# Patient Record
Sex: Female | Born: 1955 | Race: White | Hispanic: No | Marital: Married | Smoking: Former smoker
Health system: Southern US, Community
[De-identification: ages and names within clinical notes are randomized; demographics above are authoritative.]

## PROBLEM LIST (undated history)

## (undated) DIAGNOSIS — R51 Headache: Secondary | ICD-10-CM

## (undated) DIAGNOSIS — J302 Other seasonal allergic rhinitis: Secondary | ICD-10-CM

## (undated) DIAGNOSIS — E119 Type 2 diabetes mellitus without complications: Secondary | ICD-10-CM

## (undated) DIAGNOSIS — N200 Calculus of kidney: Secondary | ICD-10-CM

## (undated) DIAGNOSIS — R011 Cardiac murmur, unspecified: Secondary | ICD-10-CM

## (undated) HISTORY — PX: TUBAL LIGATION: SHX77

## (undated) HISTORY — PX: LITHOTRIPSY: SUR834

## (undated) HISTORY — PX: CARPAL TUNNEL RELEASE: SHX101

## (undated) HISTORY — PX: CHOLECYSTECTOMY: SHX55

## (undated) HISTORY — PX: TONSILLECTOMY: SUR1361

## (undated) HISTORY — PX: THYROIDECTOMY: SHX17

## (undated) HISTORY — PX: COLONOSCOPY: SHX174

---

## 2014-02-08 ENCOUNTER — Other Ambulatory Visit: Payer: Self-pay | Admitting: Orthopedic Surgery

## 2014-02-26 ENCOUNTER — Encounter (HOSPITAL_COMMUNITY): Payer: Self-pay

## 2014-02-28 ENCOUNTER — Encounter (HOSPITAL_COMMUNITY)
Admission: RE | Admit: 2014-02-28 | Discharge: 2014-02-28 | Disposition: A | Discharge status: Home / Self Care | Payer: BC Managed Care – PPO | Source: Ambulatory Visit | Attending: Orthopedic Surgery | Admitting: Orthopedic Surgery

## 2014-02-28 ENCOUNTER — Encounter (HOSPITAL_COMMUNITY): Payer: Self-pay

## 2014-02-28 DIAGNOSIS — Z01818 Encounter for other preprocedural examination: Secondary | ICD-10-CM | POA: Insufficient documentation

## 2014-02-28 DIAGNOSIS — R011 Cardiac murmur, unspecified: Secondary | ICD-10-CM | POA: Insufficient documentation

## 2014-02-28 DIAGNOSIS — Z01812 Encounter for preprocedural laboratory examination: Secondary | ICD-10-CM | POA: Insufficient documentation

## 2014-02-28 DIAGNOSIS — E119 Type 2 diabetes mellitus without complications: Secondary | ICD-10-CM | POA: Insufficient documentation

## 2014-02-28 HISTORY — DX: Other seasonal allergic rhinitis: J30.2

## 2014-02-28 HISTORY — DX: Headache: R51

## 2014-02-28 HISTORY — DX: Calculus of kidney: N20.0

## 2014-02-28 HISTORY — DX: Cardiac murmur, unspecified: R01.1

## 2014-02-28 HISTORY — DX: Type 2 diabetes mellitus without complications: E11.9

## 2014-02-28 LAB — PROTIME-INR
INR: 0.92 (ref 0.00–1.49)
PROTHROMBIN TIME: 12.2 s (ref 11.6–15.2)

## 2014-02-28 LAB — CBC WITH DIFFERENTIAL/PLATELET
BASOS PCT: 1 % (ref 0–1)
Basophils Absolute: 0 10*3/uL (ref 0.0–0.1)
Eosinophils Absolute: 0.2 10*3/uL (ref 0.0–0.7)
Eosinophils Relative: 3 % (ref 0–5)
HCT: 40.4 % (ref 36.0–46.0)
Hemoglobin: 14 g/dL (ref 12.0–15.0)
Lymphocytes Relative: 38 % (ref 12–46)
Lymphs Abs: 3.2 10*3/uL (ref 0.7–4.0)
MCH: 31 pg (ref 26.0–34.0)
MCHC: 34.7 g/dL (ref 30.0–36.0)
MCV: 89.6 fL (ref 78.0–100.0)
Monocytes Absolute: 0.4 10*3/uL (ref 0.1–1.0)
Monocytes Relative: 5 % (ref 3–12)
NEUTROS PCT: 53 % (ref 43–77)
Neutro Abs: 4.7 10*3/uL (ref 1.7–7.7)
Platelets: 259 10*3/uL (ref 150–400)
RBC: 4.51 MIL/uL (ref 3.87–5.11)
RDW: 12.1 % (ref 11.5–15.5)
WBC: 8.6 10*3/uL (ref 4.0–10.5)

## 2014-02-28 LAB — COMPREHENSIVE METABOLIC PANEL
ALBUMIN: 4.2 g/dL (ref 3.5–5.2)
ALK PHOS: 87 U/L (ref 39–117)
ALT: 32 U/L (ref 0–35)
AST: 27 U/L (ref 0–37)
BUN: 8 mg/dL (ref 6–23)
CO2: 27 mEq/L (ref 19–32)
Calcium: 9.7 mg/dL (ref 8.4–10.5)
Chloride: 98 mEq/L (ref 96–112)
Creatinine, Ser: 0.75 mg/dL (ref 0.50–1.10)
GFR calc Af Amer: >90 mL/min (ref >90)
GFR calc non Af Amer: >90 mL/min (ref >90)
Glucose, Bld: 113 mg/dL — ABNORMAL HIGH (ref 70–99)
POTASSIUM: 4.2 meq/L (ref 3.7–5.3)
Sodium: 139 mEq/L (ref 137–147)
Total Bilirubin: 0.3 mg/dL (ref 0.3–1.2)
Total Protein: 7 g/dL (ref 6.0–8.3)

## 2014-02-28 LAB — URINALYSIS, ROUTINE W REFLEX MICROSCOPIC
BILIRUBIN URINE: NEGATIVE
Glucose, UA: NEGATIVE mg/dL
Hgb urine dipstick: NEGATIVE
Ketones, ur: NEGATIVE mg/dL
NITRITE: NEGATIVE
PROTEIN: NEGATIVE mg/dL
SPECIFIC GRAVITY, URINE: 1.011 (ref 1.005–1.030)
UROBILINOGEN UA: 0.2 mg/dL (ref 0.0–1.0)
pH: 5 (ref 5.0–8.0)

## 2014-02-28 LAB — TYPE AND SCREEN
ABO/RH(D): O POS
ANTIBODY SCREEN: NEGATIVE

## 2014-02-28 LAB — ABO/RH: ABO/RH(D): O POS

## 2014-02-28 LAB — APTT: aPTT: 32 seconds (ref 24–37)

## 2014-02-28 LAB — SURGICAL PCR SCREEN
MRSA, PCR: NEGATIVE
Staphylococcus aureus: NEGATIVE

## 2014-02-28 LAB — URINE MICROSCOPIC-ADD ON

## 2014-02-28 NOTE — Progress Notes (Signed)
Pt denies SOB, chest pain, and being under the care of a cardiologist. Pt denies having a stress test, echo, and cardiac cath. Results from recent EKG requested from [facility], Dr. Mauricio Po, PCP.

## 2014-02-28 NOTE — Pre-Procedure Instructions (Signed)
Kristine Hardy  02/28/2014   Your procedure is scheduled on: Wednesday, March 07, 2014  Report to [facility] Short Stay (use Main Entrance "A'') at 6:30 AM.  Call this number if you have problems the morning of surgery: [phone]   Remember:   Do not eat food or drink liquids after midnight.   Take these medicines the morning of surgery with A SIP OF WATER: cetirizine (ZYRTEC), if needed: gabapentin (NEURONTIN) for pain Stop taking Aspirin, vitamins and herbal medications. Do not take any NSAIDs ie: Ibuprofen, Advil, Naproxen or any medication containing Aspirin.  Do not wear jewelry, make-up or nail polish.  Do not wear lotions, powders, or perfumes. You may wear deodorant.  Do not shave 48 hours prior to surgery.   Do not bring valuables to the hospital.  [facility] is not responsible for any belongings or valuables.               Contacts, dentures or bridgework may not be worn into surgery.  Leave suitcase in the car. After surgery it may be brought to your room.  For patients admitted to the hospital, discharge time is determined by your treatment team.               Patients discharged the day of surgery will not be allowed to drive home.  Name and phone number of your driver:  Special Instructions:  Special Instructions:Special Instructions: [facility] - Preparing for Surgery  Before surgery, you can play an important role.  Because skin is not sterile, your skin needs to be as free of germs as possible.  You can reduce the number of germs on you skin by washing with CHG (chlorahexidine gluconate) soap before surgery.  CHG is an antiseptic cleaner which kills germs and bonds with the skin to continue killing germs even after washing.  Please DO NOT use if you have an allergy to CHG or antibacterial soaps.  If your skin becomes reddened/irritated stop using the CHG and inform your nurse when you arrive at Short Stay.  Do not shave (including legs and underarms) for at least 48  hours prior to the first CHG shower.  You may shave your face.  Please follow these instructions carefully:   1.  Shower with CHG Soap the night before surgery and the morning of Surgery.  2.  If you choose to wash your hair, wash your hair first as usual with your normal shampoo.  3.  After you shampoo, rinse your hair and body thoroughly to remove the Shampoo.  4.  Use CHG as you would any other liquid soap.  You can apply chg directly  to the skin and wash gently with scrungie or a clean washcloth.  5.  Apply the CHG Soap to your body ONLY FROM THE NECK DOWN.  Do not use on open wounds or open sores.  Avoid contact with your eyes, ears, mouth and genitals (private parts).  Wash genitals (private parts) with your normal soap.  6.  Wash thoroughly, paying special attention to the area where your surgery will be performed.  7.  Thoroughly rinse your body with warm water from the neck down.  8.  DO NOT shower/wash with your normal soap after using and rinsing off the CHG Soap.  9.  Pat yourself dry with a clean towel.            10.  Wear clean pajamas.  11.  Place clean sheets on your bed the night of your first shower and do not sleep with pets.  Day of Surgery  Do not apply any lotions the morning of surgery.  Please wear clean clothes to the hospital/surgery center.   Please read over the following fact sheets that you were given: Pain Booklet, Coughing and Deep Breathing, Blood Transfusion Information, MRSA Information and Surgical Site Infection Prevention

## 2014-03-01 NOTE — Progress Notes (Signed)
Spoke with Albin Felling to make MD aware that pt UA was abnormal.

## 2014-03-06 MED ORDER — CEFAZOLIN SODIUM-DEXTROSE 2-3 GM-% IV SOLR
2.0000 g | INTRAVENOUS | Status: AC
  Administered 2014-03-07: 2 g via INTRAVENOUS
  Filled 2014-03-06: qty 50

## 2014-03-07 ENCOUNTER — Ambulatory Visit (HOSPITAL_COMMUNITY): Payer: BC Managed Care – PPO

## 2014-03-07 ENCOUNTER — Observation Stay (HOSPITAL_COMMUNITY)
Admission: RE | Admit: 2014-03-07 | Discharge: 2014-03-08 | Disposition: A | Discharge status: Home / Self Care | Payer: BC Managed Care – PPO | Source: Ambulatory Visit | Attending: Orthopedic Surgery | Admitting: Orthopedic Surgery

## 2014-03-07 ENCOUNTER — Encounter (HOSPITAL_COMMUNITY): Payer: Self-pay | Admitting: *Deleted

## 2014-03-07 ENCOUNTER — Ambulatory Visit (HOSPITAL_COMMUNITY): Payer: BC Managed Care – PPO | Admitting: Certified Registered Nurse Anesthetist

## 2014-03-07 ENCOUNTER — Encounter (HOSPITAL_COMMUNITY): Payer: BC Managed Care – PPO | Admitting: Certified Registered Nurse Anesthetist

## 2014-03-07 ENCOUNTER — Encounter (HOSPITAL_COMMUNITY)
Admission: RE | Disposition: A | Discharge status: Home / Self Care | Payer: Self-pay | Source: Ambulatory Visit | Attending: Orthopedic Surgery

## 2014-03-07 DIAGNOSIS — E119 Type 2 diabetes mellitus without complications: Secondary | ICD-10-CM | POA: Insufficient documentation

## 2014-03-07 DIAGNOSIS — M5410 Radiculopathy, site unspecified: Secondary | ICD-10-CM | POA: Diagnosis present

## 2014-03-07 DIAGNOSIS — R51 Headache: Secondary | ICD-10-CM | POA: Insufficient documentation

## 2014-03-07 DIAGNOSIS — M4802 Spinal stenosis, cervical region: Principal | ICD-10-CM | POA: Insufficient documentation

## 2014-03-07 DIAGNOSIS — M129 Arthropathy, unspecified: Secondary | ICD-10-CM | POA: Insufficient documentation

## 2014-03-07 DIAGNOSIS — R011 Cardiac murmur, unspecified: Secondary | ICD-10-CM | POA: Insufficient documentation

## 2014-03-07 HISTORY — PX: ANTERIOR CERVICAL DECOMP/DISCECTOMY FUSION: SHX1161

## 2014-03-07 LAB — GLUCOSE, CAPILLARY
GLUCOSE-CAPILLARY: 125 mg/dL — AB (ref 70–99)
GLUCOSE-CAPILLARY: 118 mg/dL — AB (ref 70–99)
Glucose-Capillary: 150 mg/dL — ABNORMAL HIGH (ref 70–99)
Glucose-Capillary: 161 mg/dL — ABNORMAL HIGH (ref 70–99)

## 2014-03-07 SURGERY — ANTERIOR CERVICAL DECOMPRESSION/DISCECTOMY FUSION 3 LEVELS
Anesthesia: General | Site: Neck

## 2014-03-07 MED ORDER — THROMBIN 20000 UNITS EX SOLR
CUTANEOUS | Status: AC
  Filled 2014-03-07: qty 20000

## 2014-03-07 MED ORDER — SODIUM CHLORIDE 0.9 % IJ SOLN
3.0000 mL | Freq: Two times a day (BID) | INTRAMUSCULAR | Status: DC
  Administered 2014-03-07: 3 mL via INTRAVENOUS

## 2014-03-07 MED ORDER — LIDOCAINE HCL 4 % MT SOLN
OROMUCOSAL | Status: DC | PRN
  Administered 2014-03-07: 4 mL via TOPICAL

## 2014-03-07 MED ORDER — PROPOFOL 10 MG/ML IV BOLUS
INTRAVENOUS | Status: AC
  Filled 2014-03-07: qty 20

## 2014-03-07 MED ORDER — VECURONIUM BROMIDE 10 MG IV SOLR
INTRAVENOUS | Status: DC | PRN
Start: 2014-03-07 — End: 2014-03-07
  Administered 2014-03-07: 2 mg via INTRAVENOUS
  Administered 2014-03-07: 1 mg via INTRAVENOUS

## 2014-03-07 MED ORDER — PROPOFOL 10 MG/ML IV BOLUS
INTRAVENOUS | Status: DC | PRN
  Administered 2014-03-07: 150 mg via INTRAVENOUS

## 2014-03-07 MED ORDER — PHENYLEPHRINE HCL 10 MG/ML IJ SOLN
INTRAMUSCULAR | Status: DC | PRN
  Administered 2014-03-07: 40 ug via INTRAVENOUS
  Administered 2014-03-07: 80 ug via INTRAVENOUS

## 2014-03-07 MED ORDER — FENTANYL CITRATE 0.05 MG/ML IJ SOLN
INTRAMUSCULAR | Status: AC
  Filled 2014-03-07: qty 5

## 2014-03-07 MED ORDER — ACETAMINOPHEN 325 MG PO TABS: 650.0000 mg | ORAL_TABLET | ORAL | Status: DC | PRN

## 2014-03-07 MED ORDER — PROMETHAZINE HCL 25 MG RE SUPP: 12.5000 mg | Freq: Four times a day (QID) | RECTAL | Status: DC | PRN

## 2014-03-07 MED ORDER — LORATADINE 10 MG PO TABS
10.0000 mg | ORAL_TABLET | Freq: Every day | ORAL | Status: DC
  Filled 2014-03-07 (×2): qty 1

## 2014-03-07 MED ORDER — MENTHOL 3 MG MT LOZG
1.0000 | LOZENGE | OROMUCOSAL | Status: DC | PRN
  Filled 2014-03-07: qty 9

## 2014-03-07 MED ORDER — MIDAZOLAM HCL 5 MG/5ML IJ SOLN
INTRAMUSCULAR | Status: DC | PRN
  Administered 2014-03-07: 2 mg via INTRAVENOUS

## 2014-03-07 MED ORDER — THROMBIN 20000 UNITS EX KIT: PACK | CUTANEOUS | Status: DC | PRN

## 2014-03-07 MED ORDER — BUPIVACAINE-EPINEPHRINE 0.25% -1:200000 IJ SOLN
INTRAMUSCULAR | Status: DC | PRN
  Administered 2014-03-07 (×2): 1 mL

## 2014-03-07 MED ORDER — PROMETHAZINE HCL 25 MG/ML IJ SOLN
INTRAMUSCULAR | Status: AC
  Filled 2014-03-07: qty 1

## 2014-03-07 MED ORDER — POVIDONE-IODINE 7.5 % EX SOLN
Freq: Once | CUTANEOUS | Status: DC
  Filled 2014-03-07: qty 118

## 2014-03-07 MED ORDER — BISACODYL 5 MG PO TBEC
5.0000 mg | DELAYED_RELEASE_TABLET | Freq: Every day | ORAL | Status: DC | PRN
  Filled 2014-03-07: qty 1

## 2014-03-07 MED ORDER — SODIUM CHLORIDE 0.9 % IJ SOLN: 3.0000 mL | INTRAMUSCULAR | Status: DC | PRN

## 2014-03-07 MED ORDER — MIDAZOLAM HCL 2 MG/2ML IJ SOLN
INTRAMUSCULAR | Status: AC
  Filled 2014-03-07: qty 2

## 2014-03-07 MED ORDER — ROCURONIUM BROMIDE 50 MG/5ML IV SOLN
INTRAVENOUS | Status: AC
  Filled 2014-03-07: qty 1

## 2014-03-07 MED ORDER — THROMBIN 20000 UNITS EX SOLR
OROMUCOSAL | Status: DC | PRN
  Administered 2014-03-07: 10:00:00 via TOPICAL

## 2014-03-07 MED ORDER — GABAPENTIN 300 MG PO CAPS
300.0000 mg | ORAL_CAPSULE | Freq: Three times a day (TID) | ORAL | Status: DC | PRN
  Filled 2014-03-07: qty 1

## 2014-03-07 MED ORDER — DOCUSATE SODIUM 100 MG PO CAPS
100.0000 mg | ORAL_CAPSULE | Freq: Two times a day (BID) | ORAL | Status: DC
  Filled 2014-03-07 (×3): qty 1

## 2014-03-07 MED ORDER — HYDROMORPHONE HCL PF 1 MG/ML IJ SOLN
INTRAMUSCULAR | Status: AC
  Administered 2014-03-07: 0.5 mg via INTRAVENOUS
  Filled 2014-03-07: qty 1

## 2014-03-07 MED ORDER — ROCURONIUM BROMIDE 100 MG/10ML IV SOLN
INTRAVENOUS | Status: DC | PRN
  Administered 2014-03-07: 50 mg via INTRAVENOUS
  Administered 2014-03-07 (×2): 20 mg via INTRAVENOUS

## 2014-03-07 MED ORDER — PHENOL 1.4 % MT LIQD
1.0000 | OROMUCOSAL | Status: DC | PRN
  Filled 2014-03-07: qty 177

## 2014-03-07 MED ORDER — BUPIVACAINE-EPINEPHRINE (PF) 0.25% -1:200000 IJ SOLN
INTRAMUSCULAR | Status: AC
  Filled 2014-03-07: qty 30

## 2014-03-07 MED ORDER — ONDANSETRON HCL 4 MG/2ML IJ SOLN
4.0000 mg | INTRAMUSCULAR | Status: DC | PRN
  Administered 2014-03-07 – 2014-03-08 (×2): 4 mg via INTRAVENOUS
  Filled 2014-03-07 (×2): qty 2

## 2014-03-07 MED ORDER — HYDROMORPHONE HCL PF 1 MG/ML IJ SOLN
0.2500 mg | INTRAMUSCULAR | Status: DC | PRN
  Administered 2014-03-07 (×2): 0.5 mg via INTRAVENOUS

## 2014-03-07 MED ORDER — LIDOCAINE HCL (CARDIAC) 20 MG/ML IV SOLN
INTRAVENOUS | Status: AC
  Filled 2014-03-07: qty 5

## 2014-03-07 MED ORDER — GLYCOPYRROLATE 0.2 MG/ML IJ SOLN
INTRAMUSCULAR | Status: DC | PRN
  Administered 2014-03-07: .6 mg via INTRAVENOUS

## 2014-03-07 MED ORDER — METFORMIN HCL 500 MG PO TABS
500.0000 mg | ORAL_TABLET | Freq: Two times a day (BID) | ORAL | Status: DC
  Administered 2014-03-08: 500 mg via ORAL
  Filled 2014-03-07 (×4): qty 1

## 2014-03-07 MED ORDER — FENTANYL CITRATE 0.05 MG/ML IJ SOLN
INTRAMUSCULAR | Status: DC | PRN
  Administered 2014-03-07: 100 ug via INTRAVENOUS
  Administered 2014-03-07: 50 ug via INTRAVENOUS
  Administered 2014-03-07: 25 ug via INTRAVENOUS
  Administered 2014-03-07 (×3): 50 ug via INTRAVENOUS
  Administered 2014-03-07: 25 ug via INTRAVENOUS
  Administered 2014-03-07 (×2): 50 ug via INTRAVENOUS
  Administered 2014-03-07: 25 ug via INTRAVENOUS
  Administered 2014-03-07: 50 ug via INTRAVENOUS
  Administered 2014-03-07: 25 ug via INTRAVENOUS

## 2014-03-07 MED ORDER — FLEET ENEMA 7-19 GM/118ML RE ENEM
1.0000 | ENEMA | Freq: Once | RECTAL | Status: AC | PRN
  Filled 2014-03-07: qty 1

## 2014-03-07 MED ORDER — LIDOCAINE HCL (CARDIAC) 20 MG/ML IV SOLN
INTRAVENOUS | Status: DC | PRN
  Administered 2014-03-07: 80 mg via INTRAVENOUS

## 2014-03-07 MED ORDER — CEFAZOLIN SODIUM 1-5 GM-% IV SOLN
1.0000 g | Freq: Three times a day (TID) | INTRAVENOUS | Status: AC
  Administered 2014-03-07 – 2014-03-08 (×2): 1 g via INTRAVENOUS
  Filled 2014-03-07 (×2): qty 50

## 2014-03-07 MED ORDER — NEOSTIGMINE METHYLSULFATE 10 MG/10ML IV SOLN
INTRAVENOUS | Status: DC | PRN
  Administered 2014-03-07: 4 mg via INTRAVENOUS

## 2014-03-07 MED ORDER — LACTATED RINGERS IV SOLN
INTRAVENOUS | Status: DC | PRN
  Administered 2014-03-07 (×2): via INTRAVENOUS

## 2014-03-07 MED ORDER — PROMETHAZINE HCL 25 MG/ML IJ SOLN
6.2500 mg | INTRAMUSCULAR | Status: DC | PRN
  Administered 2014-03-07: 6.25 mg via INTRAVENOUS

## 2014-03-07 MED ORDER — DIAZEPAM 5 MG PO TABS
ORAL_TABLET | ORAL | Status: AC
  Filled 2014-03-07: qty 1

## 2014-03-07 MED ORDER — ONDANSETRON HCL 4 MG/2ML IJ SOLN
INTRAMUSCULAR | Status: AC
  Filled 2014-03-07: qty 2

## 2014-03-07 MED ORDER — ONDANSETRON HCL 4 MG/2ML IJ SOLN
INTRAMUSCULAR | Status: DC | PRN
  Administered 2014-03-07: 4 mg via INTRAVENOUS

## 2014-03-07 MED ORDER — HYDROMORPHONE HCL PF 1 MG/ML IJ SOLN
0.5000 mg | INTRAMUSCULAR | Status: DC | PRN
  Administered 2014-03-07 – 2014-03-08 (×3): 1 mg via INTRAVENOUS
  Filled 2014-03-07 (×3): qty 1

## 2014-03-07 MED ORDER — PROMETHAZINE HCL 25 MG PO TABS
12.5000 mg | ORAL_TABLET | Freq: Four times a day (QID) | ORAL | Status: DC | PRN
  Administered 2014-03-08: 12.5 mg via ORAL
  Filled 2014-03-07: qty 1

## 2014-03-07 MED ORDER — DIAZEPAM 5 MG PO TABS
5.0000 mg | ORAL_TABLET | Freq: Four times a day (QID) | ORAL | Status: DC | PRN
  Administered 2014-03-07 – 2014-03-08 (×2): 5 mg via ORAL
  Filled 2014-03-07 (×2): qty 1

## 2014-03-07 MED ORDER — PROMETHAZINE HCL 25 MG/ML IJ SOLN: 12.5000 mg | Freq: Four times a day (QID) | INTRAMUSCULAR | Status: DC | PRN

## 2014-03-07 MED ORDER — ALUM & MAG HYDROXIDE-SIMETH 200-200-20 MG/5ML PO SUSP: 30.0000 mL | Freq: Four times a day (QID) | ORAL | Status: DC | PRN

## 2014-03-07 MED ORDER — ACETAMINOPHEN 650 MG RE SUPP: 650.0000 mg | RECTAL | Status: DC | PRN

## 2014-03-07 MED ORDER — OXYCODONE-ACETAMINOPHEN 5-325 MG PO TABS
1.0000 | ORAL_TABLET | ORAL | Status: DC | PRN
  Administered 2014-03-08 (×2): 1 via ORAL
  Administered 2014-03-08: 2 via ORAL
  Filled 2014-03-07 (×2): qty 1
  Filled 2014-03-07: qty 2

## 2014-03-07 MED ORDER — SENNOSIDES-DOCUSATE SODIUM 8.6-50 MG PO TABS
1.0000 | ORAL_TABLET | Freq: Every evening | ORAL | Status: DC | PRN
  Filled 2014-03-07: qty 1

## 2014-03-07 SURGICAL SUPPLY — 79 items
BENZOIN TINCTURE PRP APPL 2/3 (GAUZE/BANDAGES/DRESSINGS) ×3 IMPLANT
BIT DRILL NEURO 2X3.1 SFT TUCH (MISCELLANEOUS) ×1 IMPLANT
BIT DRILL SKYLINE 12MM (BIT) ×1 IMPLANT
BLADE SURG 15 STRL LF DISP TIS (BLADE) ×1 IMPLANT
BLADE SURG 15 STRL SS (BLADE) ×2
BLADE SURG ROTATE 9660 (MISCELLANEOUS) ×3 IMPLANT
BUR MATCHSTICK NEURO 3.0 LAGG (BURR) IMPLANT
CARTRIDGE OIL MAESTRO DRILL (MISCELLANEOUS) ×1 IMPLANT
CLOSURE STERI-STRIP 1/2X4 (GAUZE/BANDAGES/DRESSINGS) ×1
CLOSURE WOUND 1/2 X4 (GAUZE/BANDAGES/DRESSINGS) ×1
CLSR STERI-STRIP ANTIMIC 1/2X4 (GAUZE/BANDAGES/DRESSINGS) ×2 IMPLANT
CORDS BIPOLAR (ELECTRODE) ×3 IMPLANT
COVER SURGICAL LIGHT HANDLE (MISCELLANEOUS) ×3 IMPLANT
CRADLE DONUT ADULT HEAD (MISCELLANEOUS) ×3 IMPLANT
DEVICE ENDSKLTN CRVCL 5MM-0SM (Orthopedic Implant) ×1 IMPLANT
DEVICE ENDSKLTN TCERV VBR SM 6 (Orthopedic Implant) ×1 IMPLANT
DIFFUSER DRILL AIR PNEUMATIC (MISCELLANEOUS) ×3 IMPLANT
DRAIN JACKSON RD 7FR 3/32 (WOUND CARE) IMPLANT
DRAPE C-ARM 42X72 X-RAY (DRAPES) ×3 IMPLANT
DRAPE POUCH INSTRU U-SHP 10X18 (DRAPES) ×3 IMPLANT
DRAPE SURG 17X23 STRL (DRAPES) ×9 IMPLANT
DRILL BIT SKYLINE 12MM (BIT) ×2
DRILL NEURO 2X3.1 SOFT TOUCH (MISCELLANEOUS) ×3
DURAPREP 26ML APPLICATOR (WOUND CARE) ×3 IMPLANT
ELECT COATED BLADE 2.86 ST (ELECTRODE) ×3 IMPLANT
ELECT REM PT RETURN 9FT ADLT (ELECTROSURGICAL) ×3
ELECTRODE REM PT RTRN 9FT ADLT (ELECTROSURGICAL) ×1 IMPLANT
ENDOSKELETON CERVICAL 5MM-0SM (Orthopedic Implant) ×3 IMPLANT
ENDOSKELETON T CERV VBR SM 6MM (Orthopedic Implant) ×3 IMPLANT
EVACUATOR SILICONE 100CC (DRAIN) IMPLANT
GAUZE SPONGE 4X4 16PLY XRAY LF (GAUZE/BANDAGES/DRESSINGS) ×3 IMPLANT
GLOVE BIO SURGEON STRL SZ7 (GLOVE) ×3 IMPLANT
GLOVE BIO SURGEON STRL SZ8 (GLOVE) ×3 IMPLANT
GLOVE BIOGEL PI IND STRL 7.0 (GLOVE) ×2 IMPLANT
GLOVE BIOGEL PI IND STRL 8 (GLOVE) ×1 IMPLANT
GLOVE BIOGEL PI INDICATOR 7.0 (GLOVE) ×4
GLOVE BIOGEL PI INDICATOR 8 (GLOVE) ×2
GOWN STRL REUS W/ TWL LRG LVL3 (GOWN DISPOSABLE) ×2 IMPLANT
GOWN STRL REUS W/ TWL XL LVL3 (GOWN DISPOSABLE) ×1 IMPLANT
GOWN STRL REUS W/TWL LRG LVL3 (GOWN DISPOSABLE) ×4
GOWN STRL REUS W/TWL XL LVL3 (GOWN DISPOSABLE) ×2
IMPL S ENDOSKEL TC 7 ODEG (Orthopedic Implant) ×1 IMPLANT
IMPLANT S ENDOSKEL TC 7 ODEG (Orthopedic Implant) ×3 IMPLANT
IV CATH 14GX2 1/4 (CATHETERS) ×3 IMPLANT
KIT BASIN OR (CUSTOM PROCEDURE TRAY) ×3 IMPLANT
KIT ROOM TURNOVER OR (KITS) ×3 IMPLANT
MANIFOLD NEPTUNE II (INSTRUMENTS) IMPLANT
NEEDLE 27GAX1X1/2 (NEEDLE) ×3 IMPLANT
NEEDLE SPNL 20GX3.5 QUINCKE YW (NEEDLE) ×3 IMPLANT
NS IRRIG 1000ML POUR BTL (IV SOLUTION) ×3 IMPLANT
OIL CARTRIDGE MAESTRO DRILL (MISCELLANEOUS) ×3
PACK ORTHO CERVICAL (CUSTOM PROCEDURE TRAY) ×3 IMPLANT
PAD ARMBOARD 7.5X6 YLW CONV (MISCELLANEOUS) ×6 IMPLANT
PATTIES SURGICAL .5 X.5 (GAUZE/BANDAGES/DRESSINGS) IMPLANT
PATTIES SURGICAL .5 X1 (DISPOSABLE) IMPLANT
PIN TEMP SKYLINE THREADED (PIN) ×3 IMPLANT
PLATE SKYLINE 3LVL 45MM CERV (Plate) ×3 IMPLANT
PUTTY BONE DBX 5CC MIX (Putty) ×3 IMPLANT
SCREW SKYLINE VAR OS 14MM (Screw) ×9 IMPLANT
SCREW VAR SELF TAP SKYLINE 14M (Screw) ×15 IMPLANT
SPONGE GAUZE 4X4 12PLY (GAUZE/BANDAGES/DRESSINGS) ×3 IMPLANT
SPONGE GAUZE 4X4 12PLY STER LF (GAUZE/BANDAGES/DRESSINGS) ×3 IMPLANT
SPONGE INTESTINAL PEANUT (DISPOSABLE) ×9 IMPLANT
SPONGE SURGIFOAM ABS GEL 100 (HEMOSTASIS) IMPLANT
STRIP CLOSURE SKIN 1/2X4 (GAUZE/BANDAGES/DRESSINGS) ×2 IMPLANT
SURGIFLO TRUKIT (HEMOSTASIS) IMPLANT
SUT MNCRL AB 4-0 PS2 18 (SUTURE) IMPLANT
SUT SILK 4 0 (SUTURE)
SUT SILK 4-0 18XBRD TIE 12 (SUTURE) IMPLANT
SUT VIC AB 2-0 CT2 18 VCP726D (SUTURE) ×3 IMPLANT
SYR BULB IRRIGATION 50ML (SYRINGE) ×3 IMPLANT
SYR CONTROL 10ML LL (SYRINGE) ×3 IMPLANT
TAPE CLOTH 4X10 WHT NS (GAUZE/BANDAGES/DRESSINGS) ×3 IMPLANT
TAPE UMBILICAL COTTON 1/8X30 (MISCELLANEOUS) ×6 IMPLANT
TOWEL OR 17X24 6PK STRL BLUE (TOWEL DISPOSABLE) ×3 IMPLANT
TOWEL OR 17X26 10 PK STRL BLUE (TOWEL DISPOSABLE) ×3 IMPLANT
TRAY FOLEY CATH 16FRSI W/METER (SET/KITS/TRAYS/PACK) IMPLANT
WATER STERILE IRR 1000ML POUR (IV SOLUTION) IMPLANT
YANKAUER SUCT BULB TIP NO VENT (SUCTIONS) ×3 IMPLANT

## 2014-03-07 NOTE — Transfer of Care (Signed)
Immediate Anesthesia Transfer of Care Note  Patient: Kristine Hardy  Procedure(s) Performed: Procedure(s) with comments: ANTERIOR CERVICAL DECOMPRESSION/DISCECTOMY FUSION 3 LEVELS (N/A) - Anterior cervical decompression fusion, cervical 4-5, cervical 5-6, cervical 6-7 with instrumentation and allograft  Patient Location: PACU  Anesthesia Type:General  Level of Consciousness: awake, alert  and oriented  Airway & Oxygen Therapy: Patient Spontanous Breathing  Post-op Assessment: Report given to PACU RN  Post vital signs: Reviewed and stable  Complications: No apparent anesthesia complications

## 2014-03-07 NOTE — Anesthesia Preprocedure Evaluation (Addendum)
Anesthesia Evaluation  Patient identified by MRN, date of birth, ID band Patient awake    Reviewed: Allergy & Precautions, H&P , NPO status , Patient's Chart, lab work & pertinent test results  Airway Mallampati: I  Neck ROM: Limited    Dental  (+) Edentulous Upper   Pulmonary former smoker,  breath sounds clear to auscultation        Cardiovascular + Valvular Problems/Murmurs Rhythm:Regular Rate:Normal     Neuro/Psych    GI/Hepatic negative GI ROS, Neg liver ROS,   Endo/Other  diabetes  Renal/GU      Musculoskeletal  (+) Arthritis -,   Abdominal   Peds  Hematology   Anesthesia Other Findings   Reproductive/Obstetrics                         Anesthesia Physical Anesthesia Plan  ASA: II  Anesthesia Plan: General   Post-op Pain Management:    Induction: Intravenous  Airway Management Planned: Oral ETT  Additional Equipment:   Intra-op Plan:   Post-operative Plan: Extubation in OR  Informed Consent: I have reviewed the patients History and Physical, chart, labs and discussed the procedure including the risks, benefits and alternatives for the proposed anesthesia with the patient or authorized representative who has indicated his/her understanding and acceptance.   Dental advisory given  Plan Discussed with: CRNA, Surgeon and Anesthesiologist  Anesthesia Plan Comments:        Anesthesia Quick Evaluation

## 2014-03-07 NOTE — Plan of Care (Signed)
Problem: Consults Goal: Diagnosis - Spinal Surgery Outcome: Completed/Met Date Met:  03/07/14 Cervical Spine Fusion
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
## 2014-03-07 NOTE — Progress Notes (Signed)
Orthopedic Tech Progress Note Patient Details:  Kristine Hardy 1956/05/06 [id number]  Ortho Devices Type of Ortho Device: [place] cervical collar Ortho Device/Splint Location: neck Ortho Device/Splint Interventions: Freeman Caldron, Andrika Peraza 03/07/2014, 4:22 PM

## 2014-03-08 LAB — GLUCOSE, CAPILLARY
GLUCOSE-CAPILLARY: 108 mg/dL — AB (ref 70–99)
Glucose-Capillary: 116 mg/dL — ABNORMAL HIGH (ref 70–99)

## 2014-03-08 NOTE — Anesthesia Postprocedure Evaluation (Signed)
  Anesthesia Post-op Note  Patient: Kristine Hardy  Procedure(s) Performed: Procedure(s) with comments: ANTERIOR CERVICAL DECOMPRESSION/DISCECTOMY FUSION 3 LEVELS (N/A) - Anterior cervical decompression fusion, cervical 4-5, cervical 5-6, cervical 6-7 with instrumentation and allograft  Patient Location: PACU  Anesthesia Type:General  Level of Consciousness: awake and alert   Airway and Oxygen Therapy: Patient Spontanous Breathing  Post-op Pain: mild  Post-op Assessment: Post-op Vital signs reviewed  Post-op Vital Signs: stable  Last Vitals:  Filed Vitals:   03/08/14 0758  BP: 128/72  Pulse: 83  Temp: 37 C  Resp: 18    Complications: No apparent anesthesia complications

## 2014-03-08 NOTE — Progress Notes (Signed)
    Patient doing well Right arm pain resolved C/o expected posterior neck pain Has ambulated   Physical Exam: Filed Vitals:   03/08/14 0400  BP: 119/79  Pulse: 90  Temp: 97.9 F (36.6 C)  Resp: 18    Dressing in place NVI Neck soft/supple  POD #1 s/p C4-7 ACDF  - encourage ambulation - Percocet for pain, Valium for muscle spasms - likely d/c home today

## 2014-03-08 NOTE — Discharge Instructions (Signed)
Anterior Cervical Diskectomy and Fusion °Anterior cervical diskectomy is surgery done on the upper spine to relieve pressure on one or more nerve roots, or on the spinal cord. There are 7 bones in your neck, called the cervical spine. These 7 bones (vertebrae) sit one on top of the other. Cushions (intervertebral disks) separate the vertebrae and act like shock absorbers. As we age, degeneration of our bones, joints, and disks can cause neck pain and tightening around the spinal cord and nerve roots. This causes arm pain and weakness.  °Degeneration involves: °· Herniated Disk. With age, the disks dry up and can rupture. In this condition, the center of the disk bulges out (disk herniation). This can cause pressure on a nerve, which produces pain or weakness in the arm. °· Bone spurs and spinal stenosis. As we age, growths often develop on our bones. These growths are called bone spurs (osteophytes). A bone spur is a collection of calcium. As bone spurs grow and extend, the vertebral openings become narrow. The spinal canal and/or the foramen (opening for nerve passageways) become smaller. This narrowing (stenosis) may cause pinching (compression) of the spinal cord or the spinal nerve root. The nerve injury can cause pain, weakness, numbness, and loss of coordination in the upper limbs. Often, patients have difficulty with their hand writing or they start dropping things, because their hand grip is weaker. The spinal cord damage can cause increased stiffness, more frequent falls, electric shooting pain, and changes in bowel and bladder control. °Degeneration in the neck results in three common problems: °· Radiculopathy - Nerve compression that results in weakness or pain that radiates down the arm. °· Myelopathy - Spinal cord compression that causes stiffness, difficulty with walking, coordination, and trouble with bowel or bladder habits. °· Neck pain - Worn out joints cause pain as the neck  moves. °Treatment: °· Radiculopathy - Surgery is performed to remove the bony and disk material that is pushing on the nerve. °· Myelopathy - Surgery is performed to remove the bony and disk material that pushes on the spinal cord. °· Neck pain - Surgery is performed to combine (fuse) the joints of the neck together, so they cannot move or cause pain. °Surgery can be done from the front or the back of the neck. When it is done from the front, it is called an anterior (front) cervical (neck) diskectomy (removal of the disk) and fusion. °LET YOUR CAREGIVER KNOW ABOUT:  °· Recent infections. °· Any shooting pains down your leg, when you move your neck. °· Any difficulty swallowing. °· A smoking history. °· Use of blood thinners or anti-inflammatory medicines. °· Any history of injury to your shoulders. °· Any history of injury to your vocal cords. °· Any foreign objects in your body from a previous surgery. °· Any recent fevers or illness. °· Past medical history (diabetes, strokes). °· Past problems with anesthetics. °· Possibility of pregnancy. °· History of blood clots (deep vein thrombosis). °· History of bleeding or blood problems. °· Past surgeries. °· Other health problems. °· Allergies. °· Medicines you take, including herbs, eye drops, over-the-counter medicines, and creams. °· Use of steroids (by mouth or creams). °RISKS AND COMPLICATIONS °· Infection. °· Bleeding. °· Injury to the following structures: °· Carotid artery. This can result in a stroke or significant amount of bleeding. °· Esophagus, resulting in difficulty swallowing. °· Recurrent laryngeal nerve, resulting in hoarseness of the voice. °· Spinal cord injury, ranging from mild to complete quadriparesis (muscle weakness in   all four limbs). °· Nerve root injury, resulting in muscle weakness in the upper limb. °· Leakage of cerebrospinal fluid. °BEFORE THE PROCEDURE  °· You will be given medicine to help you sleep (general anesthetic), and a  breathing tube will be placed. °· You will be given antibiotics to keep the infection rate down. °· The incision site on your neck will be marked. °· Your neck will be cleaned, to reduce the risk of infection. °PROCEDURE  °An anterior cervical fusion means that the operation is done through the front (anterior) part of your neck. The cut made by the surgeon (incision) is usually within a skin fold line on the neck. After pushing aside the neck muscles, the surgeon removes the affected, degenerated disk and bone spurs (osteophytes), which takes the pressure off the nerves and spinal cord. This is called a decompression. The area where the disk was removed is then filled with a small piece of plastic. This plastic takes the place of the disk and keeps the nerve passageway (foramen) open and clear for the nerves. In most cases, the surgeon uses metal plates or pins (hardware) in the neck, to help stabilize the level being fused. The hardware reduces motion at that level, so it can fuse. This provides extra support to the neck. A cervical fusion procedure takes anywhere from a couple to several hours, depending on the size of the neck, history of previous surgery, and number of levels being fused. °AFTER THE PROCEDURE  °· You will likely spend 24 48 hours in the hospital. During this time, your caregivers will look for any signs of complications from the procedure. °· Your caregiver will watch you, to make sure that fluid draining from the surgery slows down. It is important that a large mass of blood does not form in your neck, which would cause difficulty with breathing. °· You will get 24 hours of antibiotics. °· You can start to eat as soon as you feel comfortable. °· Once you have started eating, walking, urinating (voiding) and having bowel movements on your own, your caregiver will discharge you home. °HOME CARE INSTRUCTIONS  °· For 2 weeks, do not soak the incision site under water. Do not swim or take baths.  Showers are okay, but rinse off the incision sites. °· Do not over exert yourself. Allow time for the incision to heal. °· It can take from 6 weeks to 6 months for fusion to take effect. Your caregiver may ask you to wear a neck collar during this time, as they check the fusion with multiple (serial) X-rays. °Document Released: 09/02/2009 Document Revised: 01/09/2013 Document Reviewed: 09/02/2009 °[employer]® Patient Information ©2014 [employer]. ° ° °

## 2014-03-08 NOTE — Progress Notes (Signed)
Pt and family given D/C instructions with Rx's, verbal understanding of teaching was given. Pt's IV was removed prior to D/C. Pt received [place] collar for showering prior to D/C. Pt D/C'd home via wheelchair @ 1645 per MD order. Pt is stable @ D/C. Rema Fendt, RN

## 2014-03-13 ENCOUNTER — Encounter (HOSPITAL_COMMUNITY): Payer: Self-pay | Admitting: Orthopedic Surgery

## 2014-03-15 NOTE — Discharge Summary (Signed)
Patient ID: Kristine Hardy MRN: [id number] DOB/AGE: Jun 10, 1956 58 y.o.  Admit date: 03/07/2014 Discharge date: 03/08/2014  Admission Diagnoses:  Active Problems:   Radiculopathy   Discharge Diagnoses:  Same  Past Medical History  Diagnosis Date  . Seasonal allergies   . Diabetes mellitus without complication   . Heart murmur   . Kidney stones   . Headache(784.0)     Surgeries: Procedure(s): ANTERIOR CERVICAL DECOMPRESSION/DISCECTOMY FUSION 3 LEVELS C4-7 ACDF on 03/07/2014   Consultants:  none  Discharged Condition: Improved  Hospital Course: Kristine Hardy is an 58 y.o. female who was admitted 03/07/2014 for operative treatment of  radiculopathy. Patient has severe unremitting pain that affects sleep, daily activities, and work/hobbies. After pre-op clearance the patient was taken to the operating room on 03/07/2014 and underwent  Procedure(s): ANTERIOR CERVICAL DECOMPRESSION/DISCECTOMY FUSION 3 LEVELS C4-7 ACDF .    Patient was given perioperative antibiotics:  Anti-infectives   Start     Dose/Rate Route Frequency Ordered Stop   03/07/14 2030  ceFAZolin (ANCEF) IVPB 1 g/50 mL premix     1 g 100 mL/hr over 30 Minutes Intravenous Every 8 hours 03/07/14 1537 03/08/14 0405   03/07/14 0600  ceFAZolin (ANCEF) IVPB 2 g/50 mL premix     2 g 100 mL/hr over 30 Minutes Intravenous On call to O.R. 03/06/14 1436 03/07/14 0831       Patient was given sequential compression devices, early ambulation to prevent DVT.  Patient benefited maximally from hospital stay and there were no complications.    Recent vital signs: BP 130/74  Pulse 88  Temp(Src) 98.7 F (37.1 C) (Oral)  Resp 18  Wt 80.372 kg (177 lb 3 oz)  SpO2 92%   Discharge Medications:     Medication List    STOP taking these medications       ibuprofen 600 MG tablet  Commonly known as:  ADVIL,MOTRIN      TAKE these medications       cetirizine 10 MG tablet  Commonly known as:  ZYRTEC  Take 10 mg by  mouth daily.     gabapentin 300 MG capsule  Commonly known as:  NEURONTIN  Take 300 mg by mouth 3 (three) times daily as needed (pain).     metFORMIN 500 MG tablet  Commonly known as:  GLUCOPHAGE  Take 500 mg by mouth 2 (two) times daily with a meal.        Diagnostic Studies: Dg Chest 2 View  02/28/2014   CLINICAL DATA:  Diabetes.  Heart murmur.  EXAM: CHEST  2 VIEW  COMPARISON:  None.  FINDINGS: The heart size and mediastinal contours are within normal limits. Both lungs are clear. The visualized skeletal structures are unremarkable.  IMPRESSION: No acute cardiopulmonary abnormality seen.   Electronically Signed   By: Roque LiasJames  Green M.D.   On: 02/28/2014 16:53   Dg Cervical Spine 1 View  03/07/2014   CLINICAL DATA:  Cervical spine surgery.  EXAM: DG CERVICAL SPINE - 1 VIEW  COMPARISON:  Cervical spine MRI 11/22/2013.  FINDINGS: Endotracheal tube and surgical sponge noted. Anterior and interbody cervical fusion mild appears to be C4 through C7. Good anatomic alignment .  IMPRESSION: Anterior and interbody lower cervical spine fusion with good anatomic alignment .   Electronically Signed   By: Maisie Fushomas  Register   On: 03/07/2014 12:26   Dg C-arm 1-60 Min  03/07/2014   CLINICAL DATA:  Cervical spine fusion.  EXAM: DG C-ARM 160 MIN  COMPARISON:  MRI cervical spine 11/22/2013.  FINDINGS: Endotracheal tube noted. Sponges noted. Lower anterior and interbody cervical spine fusion. Good anatomic bony alignment.  IMPRESSION: Postoperative changes noted. Patient has had lower anterior and interbody cervical spine fusion .   Electronically Signed   By: Maisie Fushomas  Register   On: 03/07/2014 12:10    Disposition: 01-Home or Self Care    -Written scripts for pain signed and in chart -D/C instructions sheet printed and in chart -D/C today  -F/U in office 2 weeks   Signed: Georga BoraMCKENZIE, KAYLA J 03/15/2014, 4:23 PM

## 2014-03-28 ENCOUNTER — Encounter (HOSPITAL_COMMUNITY): Payer: Self-pay | Admitting: Orthopedic Surgery

## 2014-06-20 ENCOUNTER — Encounter: Payer: Self-pay | Admitting: Vascular Surgery

## 2014-06-20 ENCOUNTER — Other Ambulatory Visit: Payer: Self-pay | Admitting: *Deleted

## 2014-06-20 DIAGNOSIS — M7989 Other specified soft tissue disorders: Secondary | ICD-10-CM

## 2014-07-04 ENCOUNTER — Encounter: Payer: Self-pay | Admitting: Vascular Surgery

## 2014-07-05 ENCOUNTER — Ambulatory Visit (HOSPITAL_COMMUNITY)
Admission: RE | Admit: 2014-07-05 | Discharge: 2014-07-05 | Disposition: A | Discharge status: Home / Self Care | Payer: BC Managed Care – PPO | Source: Ambulatory Visit | Attending: Vascular Surgery | Admitting: Vascular Surgery

## 2014-07-05 ENCOUNTER — Ambulatory Visit (INDEPENDENT_AMBULATORY_CARE_PROVIDER_SITE_OTHER): Payer: BC Managed Care – PPO | Admitting: Vascular Surgery

## 2014-07-05 ENCOUNTER — Encounter: Payer: Self-pay | Admitting: Vascular Surgery

## 2014-07-05 ENCOUNTER — Encounter (INDEPENDENT_AMBULATORY_CARE_PROVIDER_SITE_OTHER): Payer: Self-pay

## 2014-07-05 VITALS — BP 141/78 | HR 70 | Resp 16 | Ht 64.0 in | Wt 148.6 lb

## 2014-07-05 DIAGNOSIS — M7989 Other specified soft tissue disorders: Secondary | ICD-10-CM

## 2014-07-05 DIAGNOSIS — M79601 Pain in right arm: Secondary | ICD-10-CM | POA: Insufficient documentation
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
## 2014-07-05 NOTE — Addendum Note (Signed)
Addended by: Adria DillELDRIDGE-LEWIS, Dagoberto Nealy L on: 07/05/2014 05:00 PM   Modules accepted: Orders

## 2014-07-09 ENCOUNTER — Telehealth: Payer: Self-pay

## 2014-07-09 ENCOUNTER — Other Ambulatory Visit: Payer: Self-pay | Admitting: Vascular Surgery

## 2014-07-09 LAB — BUN: BUN: 9 mg/dL (ref 6–23)

## 2014-07-09 LAB — CREATININE, SERUM: CREATININE: 0.8 mg/dL (ref 0.50–1.10)

## 2014-07-09 NOTE — Telephone Encounter (Signed)
Message copied by Phillips OdorPULLINS, Rayner Erman S on Mon Jul 09, 2014  1:56 PM ------      Message from: Fredrich BirksMILLIKAN, DANA P      Created: Mon Jul 09, 2014 10:59 AM      Regarding: ?'s regarding pain medications       Patients husband said that she is in a lot of pain. Has ?'s about getting pain medication for her. He can be reached at [phone]            Thanks,      Annabelle Harmanana ------

## 2014-07-09 NOTE — Telephone Encounter (Signed)
Called husband re: question of getting pain medication.  Stated the pain can't turn neck, and can't sleep at night.  Reported she is receiving Physical Therapy 3 x/ week, and in a lot of pain.  Stated she had Cervical spine surgery in June, and continues to have pain.  Reported the pt. last saw Dr. Yevette Edwardsumonski 06/18/14, and released her from his care.  Husband voiced much dissatisfaction re: how pt. has been cared for after the spine surgery.  Advised he will need to discuss her pain with the PCP or Dr. Yevette Edwardsumonski.  Advised Dr. Darrick PennaFields is not available today.  Husband stated he has discussed his concerns with pt's PCP, and has been made aware that the PCP will contact Dr. Yevette Edwardsumonski.

## 2014-07-13 ENCOUNTER — Ambulatory Visit
Admission: RE | Admit: 2014-07-13 | Discharge: 2014-07-13 | Disposition: A | Discharge status: Home / Self Care | Payer: BC Managed Care – PPO | Source: Ambulatory Visit | Attending: Vascular Surgery | Admitting: Vascular Surgery

## 2014-07-13 DIAGNOSIS — M79601 Pain in right arm: Secondary | ICD-10-CM

## 2014-07-13 MED ORDER — IOHEXOL 350 MG/ML SOLN
50.0000 mL | Freq: Once | INTRAVENOUS | Status: AC | PRN
  Administered 2014-07-13: 50 mL via INTRAVENOUS

## 2014-07-25 ENCOUNTER — Encounter: Payer: Self-pay | Admitting: Vascular Surgery

## 2014-07-26 ENCOUNTER — Encounter: Payer: Self-pay | Admitting: Vascular Surgery

## 2014-07-26 ENCOUNTER — Ambulatory Visit (INDEPENDENT_AMBULATORY_CARE_PROVIDER_SITE_OTHER): Payer: BC Managed Care – PPO | Admitting: Vascular Surgery

## 2014-07-26 VITALS — BP 128/73 | HR 74 | Ht 64.0 in | Wt 149.2 lb

## 2014-07-26 DIAGNOSIS — M79601 Pain in right arm: Secondary | ICD-10-CM
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
## 2014-08-22 ENCOUNTER — Encounter: Payer: Self-pay | Admitting: Family

## 2016-02-18 IMAGING — RF DG C-ARM 61-120 MIN
1 series · 1 of 1 positions shown · non-contrast
Comparison: MRI cervical spine 11/22/2013.

CLINICAL DATA: Cervical spine fusion.

EXAM:
DG C-ARM 160 MIN

[Series 1: run · 1 of 1 slices shown]
[im 1/1]
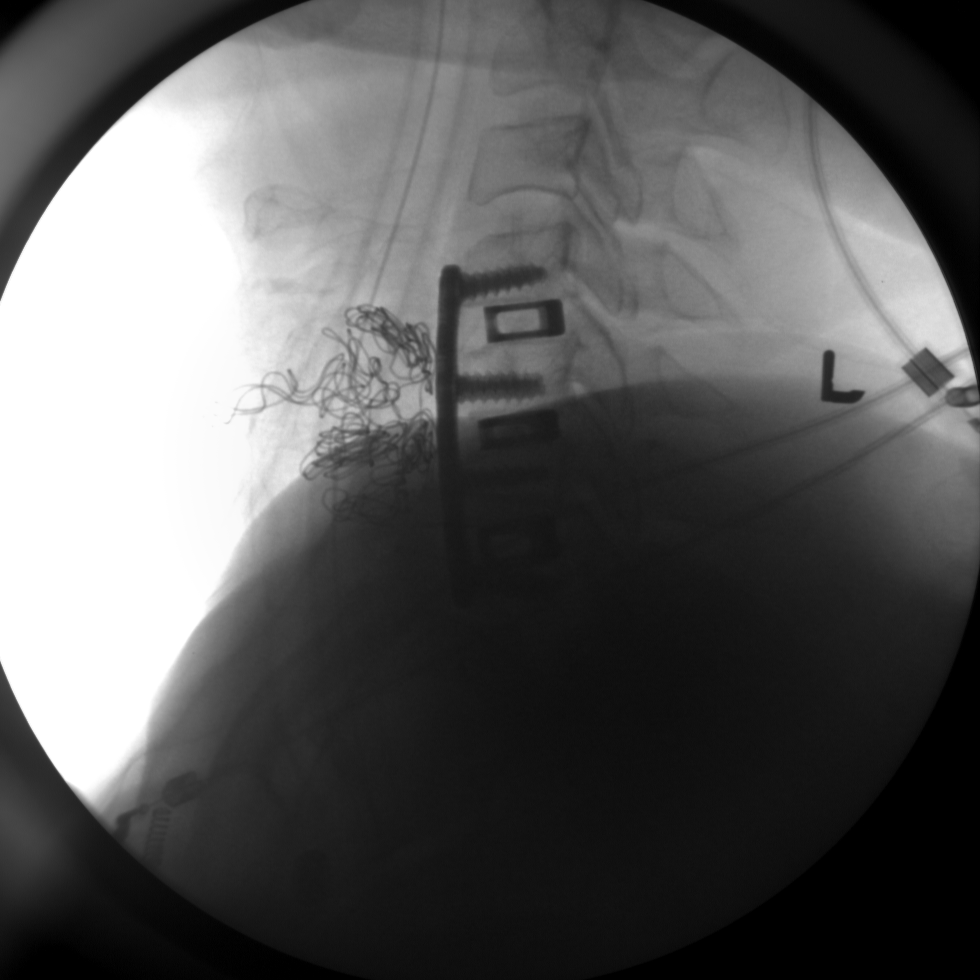

[1 of 1 positions shown; findings below may reference images not displayed]

FINDINGS: Endotracheal tube noted. Sponges noted. Lower anterior and interbody
cervical spine fusion. Good anatomic bony alignment.
IMPRESSION: Postoperative changes noted. Patient has had lower anterior and
interbody cervical spine fusion .

## 2017-04-24 NOTE — Progress Notes (Signed)
Office Visit Note  Patient: Kristine Hardy             Date of Birth: 19-Sep-1956           MRN: [id number]             PCP: Imagene Riches, NP Referring: Gardiner Rhyme, MD Visit Date: 04/28/2017 Occupation: Assistant to supervisor     Subjective:  Pain in multiple joints  History of Present Illness: Kristine Hardy is a 62 y.o. female seen in consultation per request of her PCP. According to patient she had C-spine surgery in 2010. She continued to have discomfort in her right shoulder after that. She states she was evaluated for her shoulder and was found to have a thyroid nodule. She states she had thyroid cancer and it required partial thyroidectomy. She states soon after that she started having joint pain she describes pain in her bilateral elbows and her bilateral knee joints. She also has discomfort in her upper extremities and lower extremities and lower back. She states her joint pain is getting worse over time. She states she has had cortisone injection to bilateral knee joints in the past. Her right knee joint is not as painful as the left. Her left knee joint swells off and on. She also had an episode of right index finger swelling about 2 years ago at the time she had x-ray and was diagnosed with gout. She was given allopurinol for which she's been taking for 2 years. She states her that hand pain is better. She had right carpal tunnel release in September 2017 which helped. Patient reports that she had a dog bite recently and had to had rabies shots.   Activities of Daily Living:  Patient reports morning stiffness for 10 minutes.   Patient Reports nocturnal pain.  Difficulty dressing/grooming: Denies Difficulty climbing stairs: Reports Difficulty getting out of chair: Reports Difficulty using hands for taps, buttons, cutlery, and/or writing: Reports   Review of Systems  Constitutional: Positive for fatigue. Negative for night sweats, weight gain, weight loss and weakness.    HENT: Positive for mouth dryness. Negative for mouth sores, trouble swallowing, trouble swallowing and nose dryness.   Eyes: Negative for pain, redness, visual disturbance and dryness.  Respiratory: Negative for cough, shortness of breath and difficulty breathing.   Cardiovascular: Negative for chest pain, palpitations, hypertension, irregular heartbeat and swelling in legs/feet.  Gastrointestinal: Negative for blood in stool, constipation and diarrhea.  Endocrine: Negative for increased urination.  Genitourinary: Negative for vaginal dryness.  Musculoskeletal: Positive for arthralgias, joint pain, joint swelling, myalgias, morning stiffness and myalgias. Negative for muscle weakness and muscle tenderness.  Skin: Negative for color change, rash, hair loss, skin tightness, ulcers and sensitivity to sunlight.  Allergic/Immunologic: Negative for susceptible to infections.  Neurological: Negative for dizziness, memory loss and night sweats.  Hematological: Negative for swollen glands.  Psychiatric/Behavioral: Positive for sleep disturbance. Negative for depressed mood. The patient is not nervous/anxious.        Nocturnal pain    PMFS History:  Patient Active Problem List   Diagnosis Date Noted  . DDD (degenerative disc disease), cervical. Has had cervical fusion.  04/26/2017  . Idiopathic chronic gout of multiple sites without tophus 04/26/2017  . History of hypertension 04/26/2017  . History of cholecystectomy 04/26/2017  . History of thyroid disease 04/26/2017  . History of diabetes mellitus 04/26/2017  . Right arm pain 07/05/2014  . Radiculopathy 03/07/2014    Past Medical History:  Diagnosis  Date  . Diabetes mellitus without complication ([place])   . Headache(784.0)   . Heart murmur   . Kidney stones   . Seasonal allergies     Family History  Problem Relation Age of Onset  . Lung cancer Mother   . Stroke Father   . Heart disease Father   . Lung cancer Father   . Diabetes  Father   . Cancer Other    Past Surgical History:  Procedure Laterality Date  . ANTERIOR CERVICAL DECOMP/DISCECTOMY FUSION N/A 03/07/2014   Procedure: ANTERIOR CERVICAL DECOMPRESSION/DISCECTOMY FUSION 3 LEVELS;  Surgeon: Sinclair Ship, MD;  Location: [facility];  Service: Orthopedics;  Laterality: N/A;  Anterior cervical decompression fusion, cervical 4-5, cervical 5-6, cervical 6-7 with instrumentation and allograft  . CARPAL TUNNEL RELEASE     right  . CHOLECYSTECTOMY    . COLONOSCOPY    . LITHOTRIPSY    . THYROIDECTOMY Left   . TONSILLECTOMY    . TUBAL LIGATION     Social History   Social History Narrative  . No narrative on file     Objective: Vital Signs: BP 126/82 (BP Location: Right Arm)   Pulse 80   Resp 14   Ht 5' 3"  (1.6 m)   Wt 178 lb (80.7 kg)   BMI 31.53 kg/m    Physical Exam  Constitutional: She is oriented to person, place, and time. She appears well-developed and well-nourished.  HENT:  Head: Normocephalic and atraumatic.  Eyes: Conjunctivae and EOM are normal.  Neck: Normal range of motion.  Cardiovascular: Normal rate, regular rhythm, normal heart sounds and intact distal pulses.   Pulmonary/Chest: Effort normal and breath sounds normal.  Abdominal: Soft. Bowel sounds are normal.  Lymphadenopathy:    She has no cervical adenopathy.  Neurological: She is alert and oriented to person, place, and time.  Skin: Skin is warm and dry. Capillary refill takes less than 2 seconds.  Psychiatric: She has a normal mood and affect. Her behavior is normal.  Nursing note and vitals reviewed.    Musculoskeletal Exam: C-spine and thoracic spine and lumbar spine fairly good range of motion. Shoulder joints elbow joints wrist joints are good range of motion. She had DIP PIP thickening in her bilateral hands with no synovitis. She had good range of motion of bilateral hip joints. Left knee joint has warmth swelling and decreased range of motion. Ankle joints MTPs PIPs  with good range of motion with no synovitis.  CDAI Exam: No CDAI exam completed.    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 Alcide Clever)      Imaging: Xr Hand 2 View Left  Result Date: 04/28/2017 PIP/DIP CMC narrowing. No MCP narrowing or intercarpal joint space narrowing were noted. No erosive changes were noted. Impression: These findings are consistent with mild osteoarthritis of the hand.  Xr Hand 2 View Right  Result Date: 04/28/2017 PIP/DIP CMC narrowing. No MCP narrowing or intercarpal joint space narrowing were noted. No erosive changes were noted. Impression: These findings are consistent with mild osteoarthritis of the hand.  Xr Knee 3 View Left  Result Date: 04/28/2017 Moderate medial compartment narrowing. Moderate patellofemoral narrowing. No chondrocalcinosis Impression: Moderate osteoarthritis and moderate chondromalacia patella  Xr Knee 3 View Right  Result Date:  04/28/2017 Moderate medial compartment narrowing. Moderate patellofemoral narrowing. No chondrocalcinosis Impression: Moderate osteoarthritis and moderate chondromalacia patella   Speciality Comments: No specialty comments available.    Procedures:  No procedures performed Allergies: Codeine   Assessment / Plan:     Visit Diagnoses: Myalgia: Patient complains of generalized myalgias.  Other fatigue  Pain in both hands. She has DIP PIP thickening but no synovitis on examination. - Plan: XR Hand 2 View Left, XR Hand 2 View Right which showed mild osteoarthritis of bilateral hands.  Chronic pain of both knees . She has warmth swelling and discomfort in her left knee joint.- Plan: XR KNEE 3 VIEW RIGHT, XR KNEE 3 VIEW LEFT which showed  moderate osteoarthritis and chondromalacia patella of bilateral knee joints.  DDD (degenerative disc disease), cervical. Has had cervical fusion.   Idiopathic chronic gout of multiple sites without tophus. She is on allopurinol. She states it was a started after an episode of right hand swelling.  History of hypertension  History of thyroid disease - Partial thyroidectomy due to thyroid cancer per patient  History of diabetes mellitus  History of cholecystectomy    Orders: Orders Placed This Encounter  Procedures  . XR Hand 2 View Left  . XR Hand 2 View Right  . XR KNEE 3 VIEW RIGHT  . XR KNEE 3 VIEW LEFT  . COMPLETE METABOLIC PANEL WITH GFR  . Urinalysis, Routine w reflex microscopic  . Sedimentation rate  . Rheumatoid Factor  . Cyclic citrul peptide antibody, IgG  . Antinuclear Antib (ANA)  . Uric acid  . HLA-B27 antigen  . Hepatitis panel, acute  . Quantiferon tb gold assay (blood)  . Angiotensin converting enzyme  . Serum protein electrophoresis with reflex  . IgG, IgA, IgM  . Glucose 6 phosphate dehydrogenase   No orders of the defined types were placed in this encounter.   Face-to-face time spent with patient was 50 minutes.   Follow-Up Instructions: Return for Arthralgia, myalgia, Osteoarthritis.   Bo Merino, MD  Note - This record has been created using Editor, commissioning.  Chart creation errors have been sought, but may not always  have been located. Such creation errors do not reflect on  the standard of medical care.

## 2017-04-26 DIAGNOSIS — Z8679 Personal history of other diseases of the circulatory system: Secondary | ICD-10-CM | POA: Insufficient documentation

## 2017-04-26 DIAGNOSIS — M1A09X0 Idiopathic chronic gout, multiple sites, without tophus (tophi): Secondary | ICD-10-CM | POA: Insufficient documentation

## 2017-04-26 DIAGNOSIS — Z8639 Personal history of other endocrine, nutritional and metabolic disease: Secondary | ICD-10-CM | POA: Insufficient documentation

## 2017-04-26 DIAGNOSIS — M5030 Other cervical disc degeneration, unspecified cervical region: Secondary | ICD-10-CM | POA: Insufficient documentation

## 2017-04-26 DIAGNOSIS — Z9049 Acquired absence of other specified parts of digestive tract: Secondary | ICD-10-CM | POA: Insufficient documentation

## 2017-04-28 ENCOUNTER — Ambulatory Visit (INDEPENDENT_AMBULATORY_CARE_PROVIDER_SITE_OTHER): Payer: Self-pay

## 2017-04-28 ENCOUNTER — Ambulatory Visit (INDEPENDENT_AMBULATORY_CARE_PROVIDER_SITE_OTHER): Payer: BLUE CROSS/BLUE SHIELD | Admitting: Rheumatology

## 2017-04-28 ENCOUNTER — Encounter: Payer: Self-pay | Admitting: Rheumatology

## 2017-04-28 VITALS — BP 126/82 | HR 80 | Resp 14 | Ht 63.0 in | Wt 178.0 lb

## 2017-04-28 DIAGNOSIS — Z1159 Encounter for screening for other viral diseases: Secondary | ICD-10-CM | POA: Diagnosis not present

## 2017-04-28 DIAGNOSIS — M170 Bilateral primary osteoarthritis of knee: Secondary | ICD-10-CM | POA: Diagnosis not present

## 2017-04-28 DIAGNOSIS — M791 Myalgia, unspecified site: Secondary | ICD-10-CM

## 2017-04-28 DIAGNOSIS — M79641 Pain in right hand: Secondary | ICD-10-CM | POA: Diagnosis not present

## 2017-04-28 DIAGNOSIS — Z8679 Personal history of other diseases of the circulatory system: Secondary | ICD-10-CM | POA: Diagnosis not present

## 2017-04-28 DIAGNOSIS — R5383 Other fatigue: Secondary | ICD-10-CM

## 2017-04-28 DIAGNOSIS — M25561 Pain in right knee: Secondary | ICD-10-CM

## 2017-04-28 DIAGNOSIS — Z9049 Acquired absence of other specified parts of digestive tract: Secondary | ICD-10-CM | POA: Diagnosis not present

## 2017-04-28 DIAGNOSIS — M79642 Pain in left hand: Secondary | ICD-10-CM | POA: Diagnosis not present

## 2017-04-28 DIAGNOSIS — Z111 Encounter for screening for respiratory tuberculosis: Secondary | ICD-10-CM | POA: Diagnosis not present

## 2017-04-28 DIAGNOSIS — M5030 Other cervical disc degeneration, unspecified cervical region: Secondary | ICD-10-CM | POA: Diagnosis not present

## 2017-04-28 DIAGNOSIS — M1A09X0 Idiopathic chronic gout, multiple sites, without tophus (tophi): Secondary | ICD-10-CM | POA: Diagnosis not present

## 2017-04-28 DIAGNOSIS — Z8639 Personal history of other endocrine, nutritional and metabolic disease: Secondary | ICD-10-CM | POA: Diagnosis not present

## 2017-04-28 DIAGNOSIS — M25562 Pain in left knee: Secondary | ICD-10-CM

## 2017-04-28 DIAGNOSIS — G8929 Other chronic pain: Secondary | ICD-10-CM

## 2017-04-29 LAB — COMPLETE METABOLIC PANEL WITH GFR
ALBUMIN: 4.8 g/dL (ref 3.6–5.1)
ALT: 44 U/L — ABNORMAL HIGH (ref 6–29)
AST: 38 U/L — ABNORMAL HIGH (ref 10–35)
Alkaline Phosphatase: 100 U/L (ref 33–130)
BILIRUBIN TOTAL: 0.3 mg/dL (ref 0.2–1.2)
BUN: 11 mg/dL (ref 7–25)
CALCIUM: 9.8 mg/dL (ref 8.6–10.4)
CHLORIDE: 102 mmol/L (ref 98–110)
CO2: 19 mmol/L — ABNORMAL LOW (ref 20–31)
Creat: 0.84 mg/dL (ref 0.50–0.99)
GFR, EST NON AFRICAN AMERICAN: 75 mL/min (ref >=60)
GFR, Est African American: 86 mL/min (ref >=60)
Glucose, Bld: 135 mg/dL — ABNORMAL HIGH (ref 65–99)
POTASSIUM: 4.3 mmol/L (ref 3.5–5.3)
Sodium: 141 mmol/L (ref 135–146)
Total Protein: 7.2 g/dL (ref 6.1–8.1)

## 2017-04-29 LAB — URINALYSIS, ROUTINE W REFLEX MICROSCOPIC
Bilirubin Urine: NEGATIVE
Hgb urine dipstick: NEGATIVE
Ketones, ur: NEGATIVE
LEUKOCYTES UA: NEGATIVE
Nitrite: NEGATIVE
Protein, ur: NEGATIVE
Specific Gravity, Urine: 1.034 (ref 1.001–1.035)
pH: 5.5 (ref 5.0–8.0)

## 2017-04-29 LAB — GLUCOSE 6 PHOSPHATE DEHYDROGENASE: G-6PDH: 17.9 U/g Hgb (ref 7.0–20.5)

## 2017-04-29 LAB — HEPATITIS PANEL, ACUTE
HCV AB: NONREACTIVE
HEP A IGM: NONREACTIVE
Hep B C IgM: NONREACTIVE
Hepatitis B Surface Ag: NONREACTIVE

## 2017-04-29 LAB — URINALYSIS, MICROSCOPIC ONLY
BACTERIA UA: NONE SEEN [HPF]
Casts: NONE SEEN [LPF]
Crystals: NONE SEEN [HPF]
Yeast: NONE SEEN [HPF]

## 2017-04-29 LAB — CYCLIC CITRUL PEPTIDE ANTIBODY, IGG

## 2017-04-29 LAB — IGG, IGA, IGM
IGM, SERUM: 115 mg/dL (ref 48–271)
IgA: 116 mg/dL (ref 81–463)
IgG (Immunoglobin G), Serum: 832 mg/dL (ref 694–1618)

## 2017-04-29 LAB — ANGIOTENSIN CONVERTING ENZYME: Angiotensin-Converting Enzyme: 57 U/L (ref 9–67)

## 2017-04-29 LAB — ANA: Anti Nuclear Antibody(ANA): NEGATIVE

## 2017-04-29 LAB — SEDIMENTATION RATE: Sed Rate: 1 mm/hr (ref 0–30)

## 2017-04-29 LAB — URIC ACID: Uric Acid, Serum: 4.7 mg/dL (ref 2.5–7.0)

## 2017-04-29 LAB — RHEUMATOID FACTOR: Rhuematoid fact SerPl-aCnc: <14 IU/mL (ref <14)

## 2017-04-30 LAB — QUANTIFERON TB GOLD ASSAY (BLOOD)
Interferon Gamma Release Assay: NEGATIVE
MITOGEN-NIL SO: 8.74 [IU]/mL
Quantiferon Nil Value: 0.05 IU/mL
Quantiferon Tb Ag Minus Nil Value: 0.04 IU/mL

## 2017-05-05 LAB — PROTEIN ELECTROPHORESIS, SERUM, WITH REFLEX
ALPHA-2-GLOBULIN: 0.7 g/dL (ref 0.5–0.9)
Albumin ELP: 4.7 g/dL (ref 3.8–4.8)
Alpha-1-Globulin: 0.3 g/dL (ref 0.2–0.3)
BETA 2: 0.4 g/dL (ref 0.2–0.5)
Beta Globulin: 0.6 g/dL (ref 0.4–0.6)
GAMMA GLOBULIN: 0.8 g/dL (ref 0.8–1.7)
Total Protein, Serum Electrophoresis: 7.5 g/dL (ref 6.1–8.1)

## 2017-05-05 LAB — IFE INTERPRETATION: Immunofix Electr Int: NOT DETECTED

## 2017-05-07 LAB — HLA-B27 ANTIGEN: DNA RESULT: POSITIVE — AB

## 2017-05-07 NOTE — Progress Notes (Signed)
I will discuss labs with patient follow-up visit. Her LFTs are elevated. Please advise her to reduce her Mobic to 7.5 mg by mouth daily or when necessary.

## 2017-05-26 DIAGNOSIS — M791 Myalgia, unspecified site: Secondary | ICD-10-CM | POA: Insufficient documentation

## 2017-05-26 DIAGNOSIS — G629 Polyneuropathy, unspecified: Secondary | ICD-10-CM | POA: Insufficient documentation

## 2017-05-26 DIAGNOSIS — Z1589 Genetic susceptibility to other disease: Secondary | ICD-10-CM | POA: Insufficient documentation

## 2017-05-26 NOTE — Progress Notes (Signed)
Office Visit Note  Patient: Kristine Hardy             Date of Birth: Nov 25, 1955           MRN: [id number]             PCP: Imagene Riches, NP Referring: Imagene Riches, NP Visit Date: 06/01/2017 Occupation: @GUAROCC @    Subjective:  Episodic joint pain   History of Present Illness: Kristine Hardy is a 61 y.o. female  who was seen initially for polyarthralgia. She states she continues to have some discomfort in her joints. She states the joint pain is episodic and usually comes with some rash on her lower extremities and upper extremities. She had been seen by dermatologist in the past and the workup was negative per patient.  Activities of Daily Living:  Patient reports morning stiffness for 5 minutes.   Patient Reports nocturnal pain.  Difficulty dressing/grooming: Denies Difficulty climbing stairs: Reports Difficulty getting out of chair: Reports Difficulty using hands for taps, buttons, cutlery, and/or writing: Denies   Review of Systems  Constitutional: Negative for fatigue, night sweats, weight gain, weight loss and weakness.  HENT: Negative for mouth sores, trouble swallowing, trouble swallowing, mouth dryness and nose dryness.   Eyes: Negative for pain, redness, visual disturbance and dryness.  Respiratory: Negative for cough, shortness of breath and difficulty breathing.   Cardiovascular: Negative for chest pain, palpitations, hypertension, irregular heartbeat and swelling in legs/feet.  Gastrointestinal: Negative for blood in stool, constipation and diarrhea.  Endocrine: Negative for increased urination.  Genitourinary: Negative for vaginal dryness.  Musculoskeletal: Positive for arthralgias, joint pain and morning stiffness. Negative for joint swelling, myalgias, muscle weakness, muscle tenderness and myalgias.  Skin: Positive for rash. Negative for color change, hair loss, skin tightness, ulcers and sensitivity to sunlight.  Allergic/Immunologic: Negative for  susceptible to infections.  Neurological: Negative for dizziness, memory loss and night sweats.  Hematological: Negative for swollen glands.  Psychiatric/Behavioral: Negative for depressed mood and sleep disturbance. The patient is not nervous/anxious.     PMFS History:  Patient Active Problem List   Diagnosis Date Noted  . Polyneuropathy 05/26/2017  . Myalgia 05/26/2017  . 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 DDD (degenerative disc disease), cervical. Has had cervical fusion.  04/26/2017  . Idiopathic chronic gout of multiple sites without tophus 04/26/2017  . History of hypertension 04/26/2017  . History of cholecystectomy 04/26/2017  . History of thyroid disease 04/26/2017  . History of diabetes mellitus 04/26/2017  . Right arm pain 07/05/2014  . Radiculopathy 03/07/2014    Past Medical History:  Diagnosis Date  . Diabetes mellitus without complication ([place])   . Headache(784.0)   . Heart murmur   . Kidney stones   . Seasonal allergies     Family History  Problem Relation Age of Onset  . Lung cancer Mother   . Stroke Father   . Heart disease Father   . Lung cancer Father   . Diabetes Father   . Cancer Other    Past Surgical History:  Procedure Laterality Date  . ANTERIOR CERVICAL DECOMP/DISCECTOMY FUSION N/A 03/07/2014   Procedure: ANTERIOR CERVICAL DECOMPRESSION/DISCECTOMY FUSION 3 LEVELS;  Surgeon: Sinclair Ship, MD;  Location: [facility];  Service: Orthopedics;  Laterality: N/A;  Anterior cervical decompression fusion, cervical 4-5, cervical 5-6, cervical 6-7 with instrumentation and allograft  . CARPAL TUNNEL RELEASE     right  . CHOLECYSTECTOMY    . COLONOSCOPY    . LITHOTRIPSY    .  THYROIDECTOMY Left   . TONSILLECTOMY    . TUBAL LIGATION     Social History   Social History Narrative  . No narrative on file     Objective: Vital Signs: BP 120/62   Pulse 78   Resp 16   Ht 5' 3"  (1.6 m)   Wt 170 lb (77.1 kg)   BMI 30.11 kg/m    Physical Exam    Constitutional: She is oriented to person, place, and time. She appears well-developed and well-nourished.  HENT:  Head: Normocephalic and atraumatic.  Eyes: Conjunctivae and EOM are normal.  Neck: Normal range of motion.  Cardiovascular: Normal rate, regular rhythm, normal heart sounds and intact distal pulses.   Pulmonary/Chest: Effort normal and breath sounds normal.  Abdominal: Soft. Bowel sounds are normal.  Lymphadenopathy:    She has no cervical adenopathy.  Neurological: She is alert and oriented to person, place, and time.  Skin: Skin is warm and dry. Capillary refill takes less than 2 seconds.  Psychiatric: She has a normal mood and affect. Her behavior is normal.  Nursing note and vitals reviewed.    Musculoskeletal Exam:   C-spine limited range of motion. Thoracic and lumbar spine with limited range of motion.  Shoulder joints, elbow joints, wrist joints, MCPs PIPs DIPs with good range of motion with no synovitis. She does have DIP PIP thickening consistent with osteoarthritis. Hip joints knee joints ankles MTPs PIPs DIPs with good range of motion. She is crepitus with range of motion of bilateral knee joints without any warmth or swelling.  CDAI Exam: No CDAI exam completed.    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 Alcide Clever)  CMP     Component Value Date/Time   NA 141 04/28/2017 0936   K 4.3 04/28/2017 0936   CL 102 04/28/2017 0936   CO2 19 (L) 04/28/2017 0936   GLUCOSE 135 (H) 04/28/2017 0936   BUN 11 04/28/2017 0936   CREATININE 0.84 04/28/2017 0936   CALCIUM 9.8 04/28/2017 0936   PROT 7.2 04/28/2017 0936   ALBUMIN 4.8 04/28/2017 0936   AST 38 (H) 04/28/2017 0936   ALT 44 (H) 04/28/2017 0936    ALKPHOS 100 04/28/2017 0936   BILITOT 0.3 04/28/2017 0936   GFRNONAA 75 04/28/2017 0936   GFRAA 86 04/28/2017 0936  ESR 1, RF negative, anti-CCP negative, ANA negative, uric acid 4.7, Ace 57, HLA-B27 positive Hepatitis panel negative, TB gold negative, immunoglobulins normal, IFE negative for monoclonal protein, G6PD normal UA 3+ glucose Imaging: No results found.  Speciality Comments: No specialty comments available.    Procedures:  No procedures performed Allergies: Codeine   Assessment / Plan:     Visit Diagnoses: Primary osteoarthritis of both hands - mild. Joint protection and muscle strengthening discussed.  Primary osteoarthritis of both knees - Moderate with chondromalacia patella. She continues to have pain and discomfort in her bilateral knees. She states she had cortisone injections in the past which help only for short  time. She has never tried discussed supplements. We will apply for viscose supplement injections to bilateral knee joints.  DDD (degenerative disc disease), cervical. Has had cervical fusion.   Idiopathic chronic gout of multiple sites without tophus: Her  uric acid is in desirable range.  Myalgia -  CK normal, all autoimmune workup negative  HLA B27 positive: Patient has no synovitis on examination.  Rash: Patient gives history of intermittent rash. I've advised her to schedule an appointment with dermatologist.  History of hypertension: Blood pressure is controlled.  History of diabetes mellitus  History of thyroid disease    Orders: No orders of the defined types were placed in this encounter.  No orders of the defined types were placed in this encounter.   Face-to-face time spent with patient was 30 minutes. Greater than 50% of time was spent in counseling and coordination of care.  Follow-Up Instructions: Return in about 6 months (around 11/29/2017) for Osteoarthritis DDD.   Bo Merino, MD  Note - This record has been created  using Editor, commissioning.  Chart creation errors have been sought, but may not always  have been located. Such creation errors do not reflect on  the standard of medical care.

## 2017-06-01 ENCOUNTER — Encounter: Payer: Self-pay | Admitting: Rheumatology

## 2017-06-01 ENCOUNTER — Ambulatory Visit (INDEPENDENT_AMBULATORY_CARE_PROVIDER_SITE_OTHER): Payer: BLUE CROSS/BLUE SHIELD | Admitting: Rheumatology

## 2017-06-01 VITALS — BP 120/62 | HR 78 | Resp 16 | Ht 63.0 in | Wt 170.0 lb

## 2017-06-01 DIAGNOSIS — M5030 Other cervical disc degeneration, unspecified cervical region: Secondary | ICD-10-CM | POA: Diagnosis not present

## 2017-06-01 DIAGNOSIS — M19042 Primary osteoarthritis, left hand: Secondary | ICD-10-CM

## 2017-06-01 DIAGNOSIS — Z8679 Personal history of other diseases of the circulatory system: Secondary | ICD-10-CM

## 2017-06-01 DIAGNOSIS — Z8639 Personal history of other endocrine, nutritional and metabolic disease: Secondary | ICD-10-CM | POA: Diagnosis not present

## 2017-06-01 DIAGNOSIS — G629 Polyneuropathy, unspecified: Secondary | ICD-10-CM

## 2017-06-01 DIAGNOSIS — Z1589 Genetic susceptibility to other disease: Secondary | ICD-10-CM | POA: Diagnosis not present

## 2017-06-01 DIAGNOSIS — M19041 Primary osteoarthritis, right hand: Secondary | ICD-10-CM

## 2017-06-01 DIAGNOSIS — M791 Myalgia, unspecified site: Secondary | ICD-10-CM

## 2017-06-01 DIAGNOSIS — M1A09X0 Idiopathic chronic gout, multiple sites, without tophus (tophi): Secondary | ICD-10-CM | POA: Diagnosis not present

## 2017-06-01 DIAGNOSIS — M170 Bilateral primary osteoarthritis of knee: Secondary | ICD-10-CM | POA: Diagnosis not present

## 2017-06-01 NOTE — Patient Instructions (Signed)
Natural anti-inflammatories  You can purchase these at [employer], [employer] or online.  . Turmeric (capsules)  . Ginger (ginger root or capsules)  . Omega 3 (Fish, flax seeds, chia seeds, walnuts, almonds)  . Tart cherry (dried or extract)   Patient should be under the care of a physician while taking these supplements. This may not be reproduced without the permission of Dr. Shara Hartis.   Knee Exercises Ask your health care provider which exercises are safe for you. Do exercises exactly as told by your health care provider and adjust them as directed. It is normal to feel mild stretching, pulling, tightness, or discomfort as you do these exercises, but you should stop right away if you feel sudden pain or your pain gets worse.Do not begin these exercises until told by your health care provider. STRETCHING AND RANGE OF MOTION EXERCISES These exercises warm up your muscles and joints and improve the movement and flexibility of your knee. These exercises also help to relieve pain, numbness, and tingling. Exercise A: Knee Extension, Prone 1. Lie on your abdomen on a bed. 2. Place your left / right knee just beyond the edge of the surface so your knee is not on the bed. You can put a towel under your left / right thigh just above your knee for comfort. 3. Relax your leg muscles and allow gravity to straighten your knee. You should feel a stretch behind your left / right knee. 4. Hold this position for __________ seconds. 5. Scoot up so your knee is supported between repetitions. Repeat __________ times. Complete this stretch __________ times a day. Exercise B: Knee Flexion, Active  1. Lie on your back with both knees straight. If this causes back discomfort, bend your left / right knee so your foot is flat on the floor. 2. Slowly slide your left / right heel back toward your buttocks until you feel a gentle stretch in the front of your knee or thigh. 3. Hold this position  for __________ seconds. 4. Slowly slide your left / right heel back to the starting position. Repeat __________ times. Complete this exercise __________ times a day. Exercise C: Quadriceps, Prone  1. Lie on your abdomen on a firm surface, such as a bed or padded floor. 2. Bend your left / right knee and hold your ankle. If you cannot reach your ankle or pant leg, loop a belt around your foot and grab the belt instead. 3. Gently pull your heel toward your buttocks. Your knee should not slide out to the side. You should feel a stretch in the front of your thigh and knee. 4. Hold this position for __________ seconds. Repeat __________ times. Complete this stretch __________ times a day. Exercise D: Hamstring, Supine 1. Lie on your back. 2. Loop a belt or towel over the ball of your left / right foot. The ball of your foot is on the walking surface, right under your toes. 3. Straighten your left / right knee and slowly pull on the belt to raise your leg until you feel a gentle stretch behind your knee. ? Do not let your left / right knee bend while you do this. ? Keep your other leg flat on the floor. 4. Hold this position for __________ seconds. Repeat __________ times. Complete this stretch __________ times a day. STRENGTHENING EXERCISES These exercises build strength and endurance in your knee. Endurance is the ability to use your muscles for a long time, even after they get tired. Exercise   E: Quadriceps, Isometric  1. Lie on your back with your left / right leg extended and your other knee bent. Put a rolled towel or small pillow under your knee if told by your health care provider. 2. Slowly tense the muscles in the front of your left / right thigh. You should see your kneecap slide up toward your hip or see increased dimpling just above the knee. This motion will push the back of the knee toward the floor. 3. For __________ seconds, keep the muscle as tight as you can without increasing  your pain. 4. Relax the muscles slowly and completely. Repeat __________ times. Complete this exercise __________ times a day. Exercise F: Straight Leg Raises - Quadriceps 1. Lie on your back with your left / right leg extended and your other knee bent. 2. Tense the muscles in the front of your left / right thigh. You should see your kneecap slide up or see increased dimpling just above the knee. Your thigh may even shake a bit. 3. Keep these muscles tight as you raise your leg 4-6 inches (10-15 cm) off the floor. Do not let your knee bend. 4. Hold this position for __________ seconds. 5. Keep these muscles tense as you lower your leg. 6. Relax your muscles slowly and completely after each repetition. Repeat __________ times. Complete this exercise __________ times a day. Exercise G: Hamstring, Isometric 1. Lie on your back on a firm surface. 2. Bend your left / right knee approximately __________ degrees. 3. Dig your left / right heel into the surface as if you are trying to pull it toward your buttocks. Tighten the muscles in the back of your thighs to dig as hard as you can without increasing any pain. 4. Hold this position for __________ seconds. 5. Release the tension gradually and allow your muscles to relax completely for __________ seconds after each repetition. Repeat __________ times. Complete this exercise __________ times a day. Exercise H: Hamstring Curls  If told by your health care provider, do this exercise while wearing ankle weights. Begin with __________ weights. Then increase the weight by 1 lb (0.5 kg) increments. Do not wear ankle weights that are more than __________. 1. Lie on your abdomen with your legs straight. 2. Bend your left / right knee as far as you can without feeling pain. Keep your hips flat against the floor. 3. Hold this position for __________ seconds. 4. Slowly lower your leg to the starting position.  Repeat __________ times. Complete this exercise  __________ times a day. Exercise I: Squats (Quadriceps) 1. Stand in front of a table, with your feet and knees pointing straight ahead. You may rest your hands on the table for balance but not for support. 2. Slowly bend your knees and lower your hips like you are going to sit in a chair. ? Keep your weight over your heels, not over your toes. ? Keep your lower legs upright so they are parallel with the table legs. ? Do not let your hips go lower than your knees. ? Do not bend lower than told by your health care provider. ? If your knee pain increases, do not bend as low. 3. Hold the squat position for __________ seconds. 4. Slowly push with your legs to return to standing. Do not use your hands to pull yourself to standing. Repeat __________ times. Complete this exercise __________ times a day. Exercise J: Wall Slides (Quadriceps)  1. Lean your back against a smooth wall or door   while you walk your feet out 18-24 inches (46-61 cm) from it. 2. Place your feet hip-width apart. 3. Slowly slide down the wall or door until your knees bend __________ degrees. Keep your knees over your heels, not over your toes. Keep your knees in line with your hips. 4. Hold for __________ seconds. Repeat __________ times. Complete this exercise __________ times a day. Exercise K: Straight Leg Raises - Hip Abductors 1. Lie on your side with your left / right leg in the top position. Lie so your head, shoulder, knee, and hip line up. You may bend your bottom knee to help you keep your balance. 2. Roll your hips slightly forward so your hips are stacked directly over each other and your left / right knee is facing forward. 3. Leading with your heel, lift your top leg 4-6 inches (10-15 cm). You should feel the muscles in your outer hip lifting. ? Do not let your foot drift forward. ? Do not let your knee roll toward the ceiling. 4. Hold this position for __________ seconds. 5. Slowly return your leg to the starting  position. 6. Let your muscles relax completely after each repetition. Repeat __________ times. Complete this exercise __________ times a day. Exercise L: Straight Leg Raises - Hip Extensors 1. Lie on your abdomen on a firm surface. You can put a pillow under your hips if that is more comfortable. 2. Tense the muscles in your buttocks and lift your left / right leg about 4-6 inches (10-15 cm). Keep your knee straight as you lift your leg. 3. Hold this position for __________ seconds. 4. Slowly lower your leg to the starting position. 5. Let your leg relax completely after each repetition. Repeat __________ times. Complete this exercise __________ times a day. This information is not intended to replace advice given to you by your health care provider. Make sure you discuss any questions you have with your health care provider. Document Released: 07/29/2005 Document Revised: 06/08/2016 Document Reviewed: 07/21/2015 Elsevier Interactive Patient Education  2018 [employer]  

## 2017-07-27 DIAGNOSIS — R011 Cardiac murmur, unspecified: Secondary | ICD-10-CM

## 2017-11-15 NOTE — Progress Notes (Deleted)
   Office Visit Note  Patient: Kristine Hardy             Date of Birth: January 24, 1956           MRN: [id number]             PCP: Imagene Riches, NP Referring: Imagene Riches, NP Visit Date: 11/29/2017 Occupation: _0 @    Subjective:  No chief complaint on file.   History of Present Illness: Kristine Hardy is a 62 y.o. female ***   Activities of Daily Living:  Patient reports morning stiffness for *** {minute/hour:19697}.   Patient {ACTIONS;DENIES/REPORTS:21021675::"Denies"} nocturnal pain.  Difficulty dressing/grooming: {ACTIONS;DENIES/REPORTS:21021675::"Denies"} Difficulty climbing stairs: {ACTIONS;DENIES/REPORTS:21021675::"Denies"} Difficulty getting out of chair: {ACTIONS;DENIES/REPORTS:21021675::"Denies"} Difficulty using hands for taps, buttons, cutlery, and/or writing: {ACTIONS;DENIES/REPORTS:21021675::"Denies"}   No Rheumatology ROS completed.   PMFS History:  Patient Active Problem List   Diagnosis Date Noted  . Polyneuropathy 05/26/2017  . Myalgia 05/26/2017  . 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 DDD (degenerative disc disease), cervical. Has had cervical fusion.  04/26/2017  . Idiopathic chronic gout of multiple sites without tophus 04/26/2017  . History of hypertension 04/26/2017  . History of cholecystectomy 04/26/2017  . History of thyroid disease 04/26/2017  . History of diabetes mellitus 04/26/2017  . Right arm pain 07/05/2014  . Radiculopathy 03/07/2014    Past Medical History:  Diagnosis Date  . Diabetes mellitus without complication ([place])   . Headache(784.0)   . Heart murmur   . Kidney stones   . Seasonal allergies     Family History  Problem Relation Age of Onset  . Lung cancer Mother   . Stroke Father   . Heart disease Father   . Lung cancer Father   . Diabetes Father   . Cancer Other    Past Surgical History:  Procedure Laterality Date  . ANTERIOR CERVICAL DECOMP/DISCECTOMY FUSION N/A 03/07/2014   Procedure: ANTERIOR CERVICAL  DECOMPRESSION/DISCECTOMY FUSION 3 LEVELS;  Surgeon: Sinclair Ship, MD;  Location: [facility];  Service: Orthopedics;  Laterality: N/A;  Anterior cervical decompression fusion, cervical 4-5, cervical 5-6, cervical 6-7 with instrumentation and allograft  . CARPAL TUNNEL RELEASE     right  . CHOLECYSTECTOMY    . COLONOSCOPY    . LITHOTRIPSY    . THYROIDECTOMY Left   . TONSILLECTOMY    . TUBAL LIGATION     Social History   Social History Narrative  . Not on file     Objective: Vital Signs: There were no vitals taken for this visit.   Physical Exam   Musculoskeletal Exam: ***  CDAI Exam: No CDAI exam completed.    Investigation: No additional findings. Uric acid: 04/28/2017 4.7  Imaging: No results found.  Speciality Comments: No specialty comments available.    Procedures:  No procedures performed Allergies: Codeine   Assessment / Plan:     Visit Diagnoses: No diagnosis found.    Orders: No orders of the defined types were placed in this encounter.  No orders of the defined types were placed in this encounter.   Face-to-face time spent with patient was *** minutes. 50% of time was spent in counseling and coordination of care.  Follow-Up Instructions: No Follow-up on file.   Earnestine Mealing, CMA  Note - This record has been created using Editor, commissioning.  Chart creation errors have been sought, but may not always  have been located. Such creation errors do not reflect on  the standard of medical care.

## 2017-11-29 ENCOUNTER — Ambulatory Visit: Payer: BLUE CROSS/BLUE SHIELD | Admitting: Rheumatology
